# Patient Record
Sex: Male | Born: 1980 | Race: White | Marital: Married | Smoking: Never smoker
Health system: Southern US, Community
[De-identification: ages and names within clinical notes are randomized; demographics above are authoritative.]

---

## 2007-04-10 ENCOUNTER — Emergency Department: Payer: Self-pay | Admitting: Emergency Medicine

## 2007-04-18 ENCOUNTER — Emergency Department: Payer: Self-pay | Admitting: Internal Medicine

## 2007-04-24 ENCOUNTER — Emergency Department: Payer: Self-pay

## 2014-02-07 ENCOUNTER — Other Ambulatory Visit: Payer: Self-pay | Admitting: Family Medicine

## 2014-02-07 DIAGNOSIS — M25561 Pain in right knee: Secondary | ICD-10-CM

## 2014-02-10 ENCOUNTER — Other Ambulatory Visit: Payer: Self-pay | Admitting: Family Medicine

## 2014-02-10 ENCOUNTER — Ambulatory Visit
Admission: RE | Admit: 2014-02-10 | Discharge: 2014-02-10 | Disposition: A | Discharge status: Home / Self Care | Source: Ambulatory Visit | Attending: Family Medicine | Admitting: Family Medicine

## 2014-02-10 DIAGNOSIS — M25561 Pain in right knee: Secondary | ICD-10-CM

## 2014-02-10 DIAGNOSIS — S99922A Unspecified injury of left foot, initial encounter: Secondary | ICD-10-CM

## 2014-02-14 ENCOUNTER — Ambulatory Visit: Payer: Self-pay

## 2014-03-04 ENCOUNTER — Emergency Department (HOSPITAL_COMMUNITY)
Admission: EM | Admit: 2014-03-04 | Discharge: 2014-03-04 | Disposition: A | Discharge status: Home / Self Care | Attending: Emergency Medicine | Admitting: Emergency Medicine

## 2014-03-04 ENCOUNTER — Emergency Department (HOSPITAL_COMMUNITY)

## 2014-03-04 ENCOUNTER — Encounter (HOSPITAL_COMMUNITY): Payer: Self-pay | Admitting: Emergency Medicine

## 2014-03-04 DIAGNOSIS — IMO0002 Reserved for concepts with insufficient information to code with codable children: Secondary | ICD-10-CM

## 2014-03-04 DIAGNOSIS — W230XXA Caught, crushed, jammed, or pinched between moving objects, initial encounter: Secondary | ICD-10-CM | POA: Insufficient documentation

## 2014-03-04 DIAGNOSIS — S61209A Unspecified open wound of unspecified finger without damage to nail, initial encounter: Secondary | ICD-10-CM | POA: Insufficient documentation

## 2014-03-04 DIAGNOSIS — Y929 Unspecified place or not applicable: Secondary | ICD-10-CM | POA: Insufficient documentation

## 2014-03-04 DIAGNOSIS — S62639B Displaced fracture of distal phalanx of unspecified finger, initial encounter for open fracture: Secondary | ICD-10-CM | POA: Insufficient documentation

## 2014-03-04 DIAGNOSIS — Y9389 Activity, other specified: Secondary | ICD-10-CM | POA: Insufficient documentation

## 2014-03-04 MED ORDER — HYDROCODONE-ACETAMINOPHEN 5-325 MG PO TABS: 1.0000 | ORAL_TABLET | ORAL | Status: AC | PRN

## 2014-03-04 MED ORDER — CEPHALEXIN 500 MG PO CAPS: 500.0000 mg | ORAL_CAPSULE | Freq: Four times a day (QID) | ORAL | Status: AC

## 2014-03-04 NOTE — Discharge Instructions (Signed)
Please take all of your antibiotics until finished!   You may develop abdominal discomfort or diarrhea from the antibiotic.  You may help offset this with probiotics which you can buy or get in yogurt. Do not eat  or take the probiotics until 2 hours after your antibiotic.    FOLLOW UP WITH DR. GRAMIG FOR YOUR FRACTURE.  Follow up with your doctor, an urgent care, or this Emergency Department for removal of your stitches in 7 days. Do not submerge the stitches in water for the first 24 hours. Take your pain medication as prescribed. Do not operate heavy machinery while on pain medication. Note that your pain medication contains acetaminophen (Tylenol) & its is not reccommended that you use additional acetaminophen (Tylenol) while taking this medication. Read instructions below.  TREATMENT   Keep the wound clean and dry.   If you were given a bandage (dressing), you should change it at least once a day. Also, change the dressing if it becomes wet or dirty, or as directed by your caregiver.   Wash the wound with soap and water 2 times a day. Rinse the wound off with water to remove all soap. Pat the wound dry with a clean towel.   You may shower as usual after the first 24 hours. Do not soak the wound in water until the sutures are removed.   Once the wound has healed, scarring can be minimized by covering the wound with sunscreen during the day for 1 full year.Marland Kitchen.   SEEK MEDICAL CARE IF:   You have redness, swelling, or increasing pain in the wound.   You see a red line that goes away from the wound.   You have yellowish-white fluid (pus) coming from the wound.   You have a fever.   You notice a bad smell coming from the wound or dressing.   Your wound breaks open before or after sutures have been removed.   You notice something coming out of the wound such as wood or glass.   Your wound is on your hand or foot and you cannot move a finger or toe.   Your pain is not controlled with  prescribed medicine.   If you did not receive a tetanus shot today because you thought you were up to date, but did not recall when your last one was given, make sure to check with your primary caregiver to determine if you need one.

## 2014-03-04 NOTE — Progress Notes (Signed)
Orthopedic Tech Progress Note Patient Details:  Noah CoyerJimmy Stevenson 05/25/1981 [id number]  Ortho Devices Type of Ortho Device: Finger splint Ortho Device/Splint Location: rue Ortho Device/Splint Interventions: Application   Noah Stevenson 03/04/2014, 1:17 PM

## 2014-03-04 NOTE — ED Notes (Signed)
He reports he smashed his R pointer finger between a hammer and tire this am. He can wiggle the digit, skin w/d.

## 2014-03-04 NOTE — ED Provider Notes (Signed)
CSN: [id number]     Arrival date & time 03/04/14  1113 History  This chart was scribed for non-physician practitioner, Arthor CaptainAbigail Amil Bouwman, PA-C working with Doug SouSam Jacubowitz, MD by Greggory StallionKayla Andersen, ED scribe. This patient was seen in room TR06C/TR06C and the patient's care was started at 11:50 AM.   Chief Complaint  Patient presents with  . Hand Injury   The history is provided by the patient. No language interpreter was used.   HPI Comments: Noah Stevenson is a 33 y.o. male who presents to the Emergency Department complaining of right index finger injury that occurred earlier this morning. He states he smashed his finger between a hammer and a tire. Pt has a laceration to his finger and sudden onset pain with associated swelling. He also has some numbness to his finger. Palpation worsens the pain. His last tetanus was in 2014.   History reviewed. No pertinent past medical history. History reviewed. No pertinent past surgical history. History reviewed. No pertinent family history. History  Substance Use Topics  . Smoking status: Never Smoker   . Smokeless tobacco: Not on file  . Alcohol Use: Yes    Review of Systems  Constitutional: Negative for fever.  HENT: Negative for congestion.   Eyes: Negative for redness.  Respiratory: Negative for shortness of breath.   Cardiovascular: Negative for chest pain.  Gastrointestinal: Negative for abdominal distention.  Musculoskeletal: Positive for arthralgias and joint swelling.  Skin: Positive for wound.  Neurological: Positive for numbness.  Psychiatric/Behavioral: Negative for confusion.   Allergies  Review of patient's allergies indicates no known allergies.  Home Medications   Prior to Admission medications   Not on File   BP 143/89  Pulse 64  Temp(Src) 98.2 F (36.8 C) (Oral)  Resp 18  SpO2 97%  Physical Exam  Nursing note and vitals reviewed. Constitutional: He is oriented to person, place, and time. He appears well-developed and  well-nourished. No distress.  HENT:  Head: Normocephalic and atraumatic.  Eyes: EOM are normal.  Neck: Neck supple. No tracheal deviation present.  Cardiovascular: Normal rate.   Capillary refill less than 3 seconds.  Pulmonary/Chest: Effort normal. No respiratory distress.  Musculoskeletal: Normal range of motion.  1 cm jagged laceration to right first finger. Swelling at distal tip with some subdermal tissue exposed. Bruising of nail fold. Pain to palpation of distal tip.   Neurological: He is alert and oriented to person, place, and time.  Skin: Skin is warm and dry.  Psychiatric: He has a normal mood and affect. His behavior is normal.    ED Course  Procedures (including critical care time)  DIAGNOSTIC STUDIES: Oxygen Saturation is 97% on RA, normal by my interpretation.    COORDINATION OF CARE: 11:52 AM-Discussed treatment plan which includes an xray and laceration repair with pt at bedside and pt agreed to plan.   NERVE BLOCK Performed by: Arthor CaptainAbigail Jaeda Bruso Consent: Verbal consent obtained. Required items: required blood products, implants, devices, and special equipment available Time out: Immediately prior to procedure a "time out" was called to verify the correct patient, procedure, equipment, support staff and site/side marked as required.  Indication: pain relief/suture repair Nerve block body site: R 2nd finger  Preparation: Patient was prepped and draped in the usual sterile fashion. Needle gauge: 27 G Location technique: anatomical landmarks  Local anesthetic: lidocaine 2% w/o epi  Anesthetic total: 8 ml  Outcome: pain improved Patient tolerance: Patient tolerated the procedure well with no immediate complications.  LACERATION REPAIR PROCEDURE NOTE  The patient's identification was confirmed and consent was obtained. This procedure was performed by Arthor CaptainAbigail Contessa Preuss, PA-C at 12:37 PM. Site: right first finger Sterile procedures observed Anesthesia: local  infiltration Anesthetic used (type and amt): 8 mL 2% lidocaine without epi Suture type/size: 5-0 Prolene Length: 1 cm # of Sutures: 4 Technique: simple interrupted Complexity: simple Antibx ointment applied Tetanus UTD Site anesthetized, irrigated with NS, explored without evidence of foreign body, wound well approximated, site covered with dry, sterile dressing.  Patient tolerated procedure well without complications. Instructions for care discussed verbally and patient provided with additional written instructions for homecare and f/u.  SPLINT APPLICATION Date/Time: 2:44 PM Authorized by: Arthor CaptainAbigail Aubreyanna Dorrough Consent: Verbal consent obtained. Risks and benefits: risks, benefits and alternatives were discussed Consent given by: patient Splint applied by: orthopedic technician Location details: R first finger Splint type: static finger Supplies used: bandage, splint Post-procedure: The splinted body part was neurovascularly unchanged following the procedure. Patient tolerance: Patient tolerated the procedure well with no immediate complications.    Labs Review Labs Reviewed - No data to display  Imaging Review Dg Finger Index Right  03/04/2014   CLINICAL DATA:  Injury, pain distally  EXAM: RIGHT INDEX FINGER 2+V  COMPARISON:  None.  FINDINGS: There is a small minimally displaced fracture of the right second digit distal phalanx tuft. Overlying soft tissue injury noted. Normal alignment. No subluxation dislocation. Preserved joint spaces. No other acute osseous finding.  IMPRESSION: Right second digit distal phalangeal tuft fracture   Electronically Signed   By: Ruel Favorsrevor  Shick M.D.   On: 03/04/2014 11:52     EKG Interpretation None      MDM   Final diagnoses:  Open fracture of tuft of distal phalanx of finger  Laceration    Patient with open tuft fracture and laceration. He is utd on tdap. Patient's laceration repaired. Splint placed. NV intact after splint placement.  D/c  with pain meds and keflex. Follow with Hand specialist.Have sutures removed in 7 days. Supportive care discussed. Work note and precautions given.  I personally performed the services described in this documentation, which was scribed in my presence. The recorded information has been reviewed and is accurate.  Arthor CaptainAbigail Belicia Difatta, PA-C 03/06/14 [phone]

## 2014-03-04 NOTE — ED Notes (Signed)
Ortho tech called for finger splint  

## 2014-03-04 NOTE — ED Notes (Signed)
Right index finger with laceration to pad. Continues to ooze blood without pressure. Tetanus UTD.

## 2014-03-07 NOTE — ED Provider Notes (Signed)
Medical screening examination/treatment/procedure(s) were performed by non-physician practitioner and as supervising physician I was immediately available for consultation/collaboration.   EKG Interpretation None       Doug SouSam Myliah Medel, MD 03/07/14 [id number]

## 2015-09-23 IMAGING — CR DG FOOT COMPLETE 3+V*L*
3 series · 3 of 3 positions shown · non-contrast
Comparison: None.

CLINICAL DATA: 32-year-old male with left foot injury and lateral
pain.

EXAM:
LEFT FOOT - COMPLETE 3+ VIEW

[view not recorded (1 of 3)]
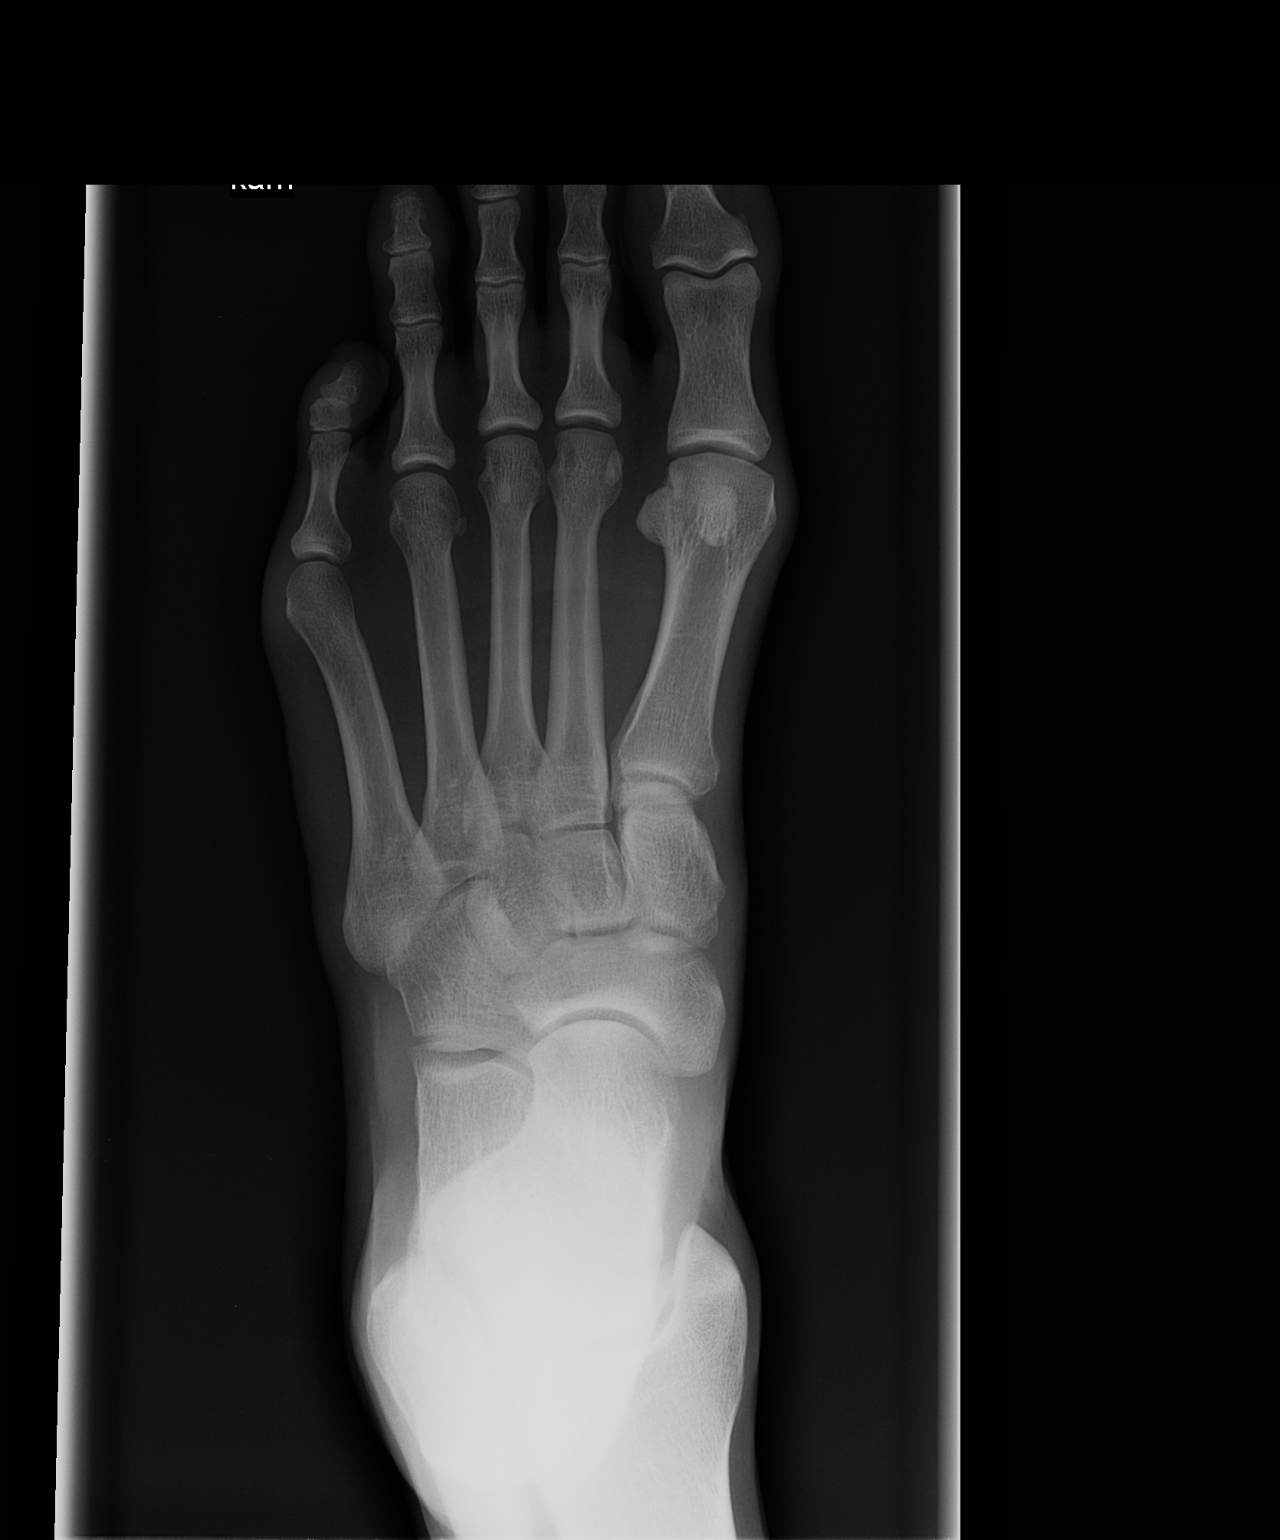

[view not recorded (2 of 3)]
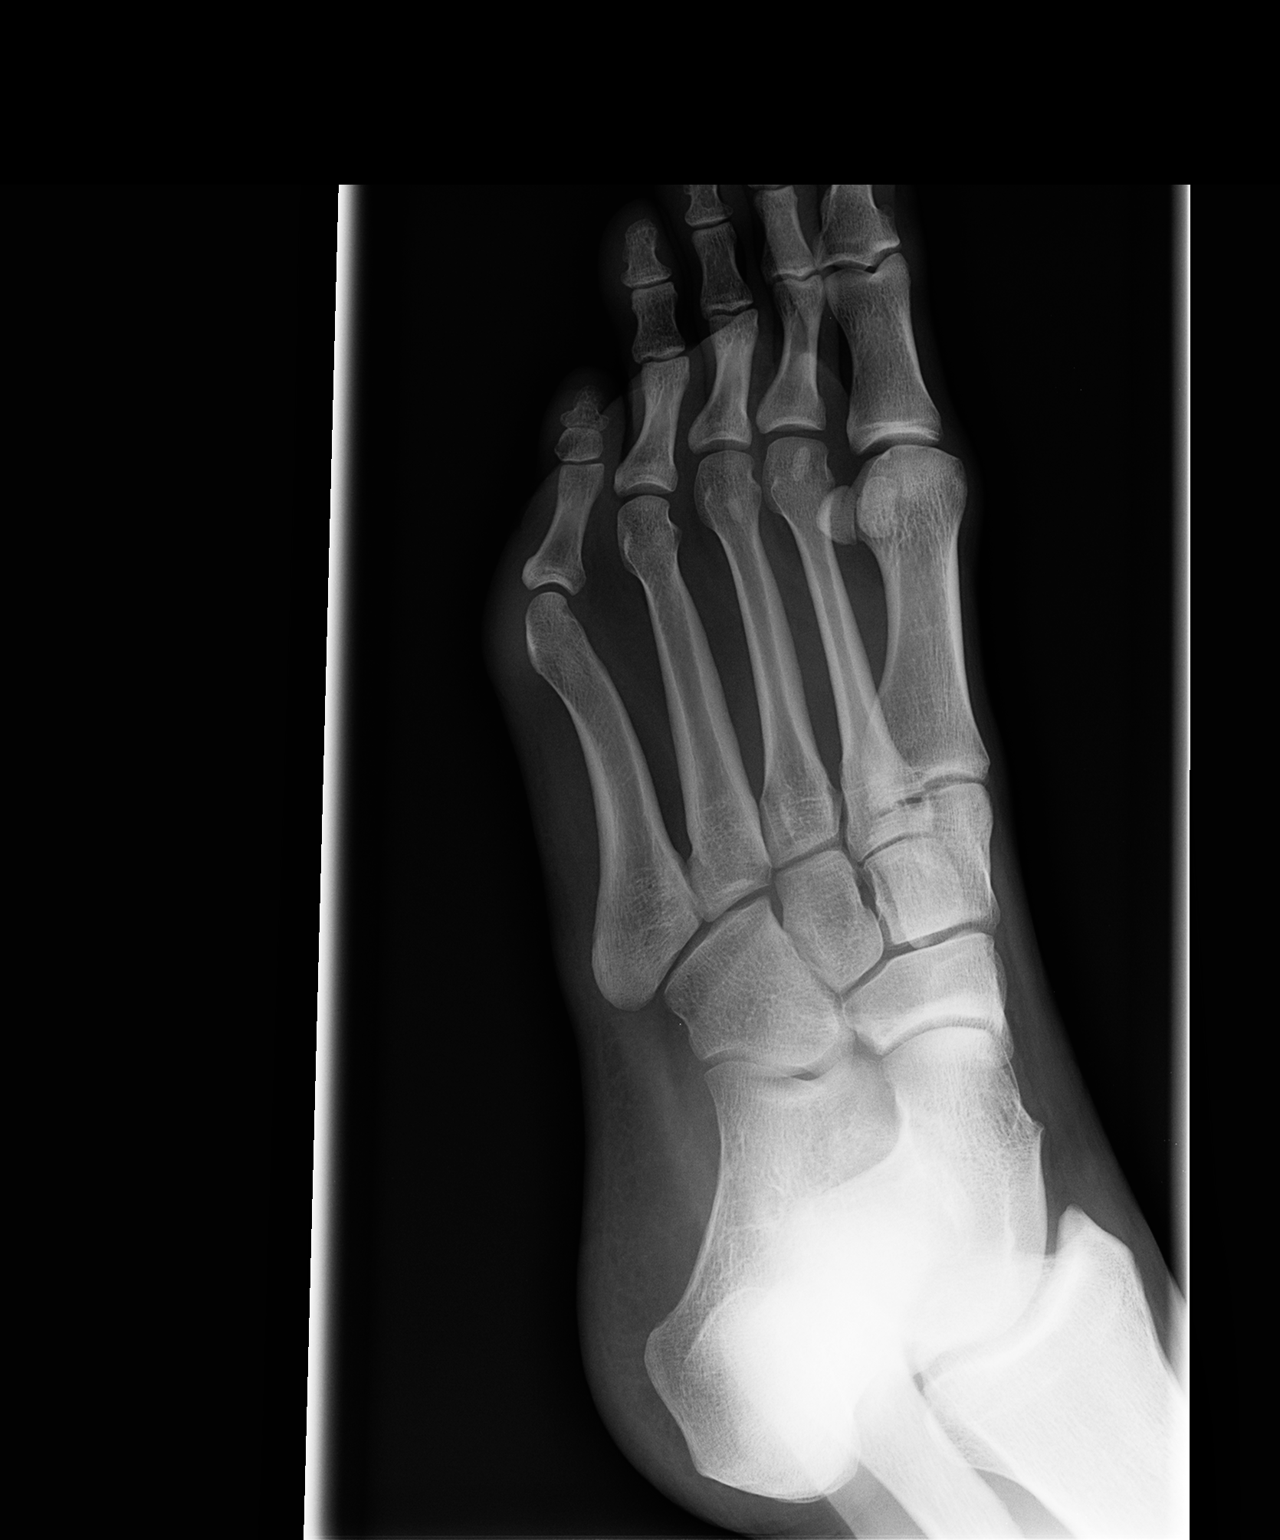

[view not recorded (3 of 3)]
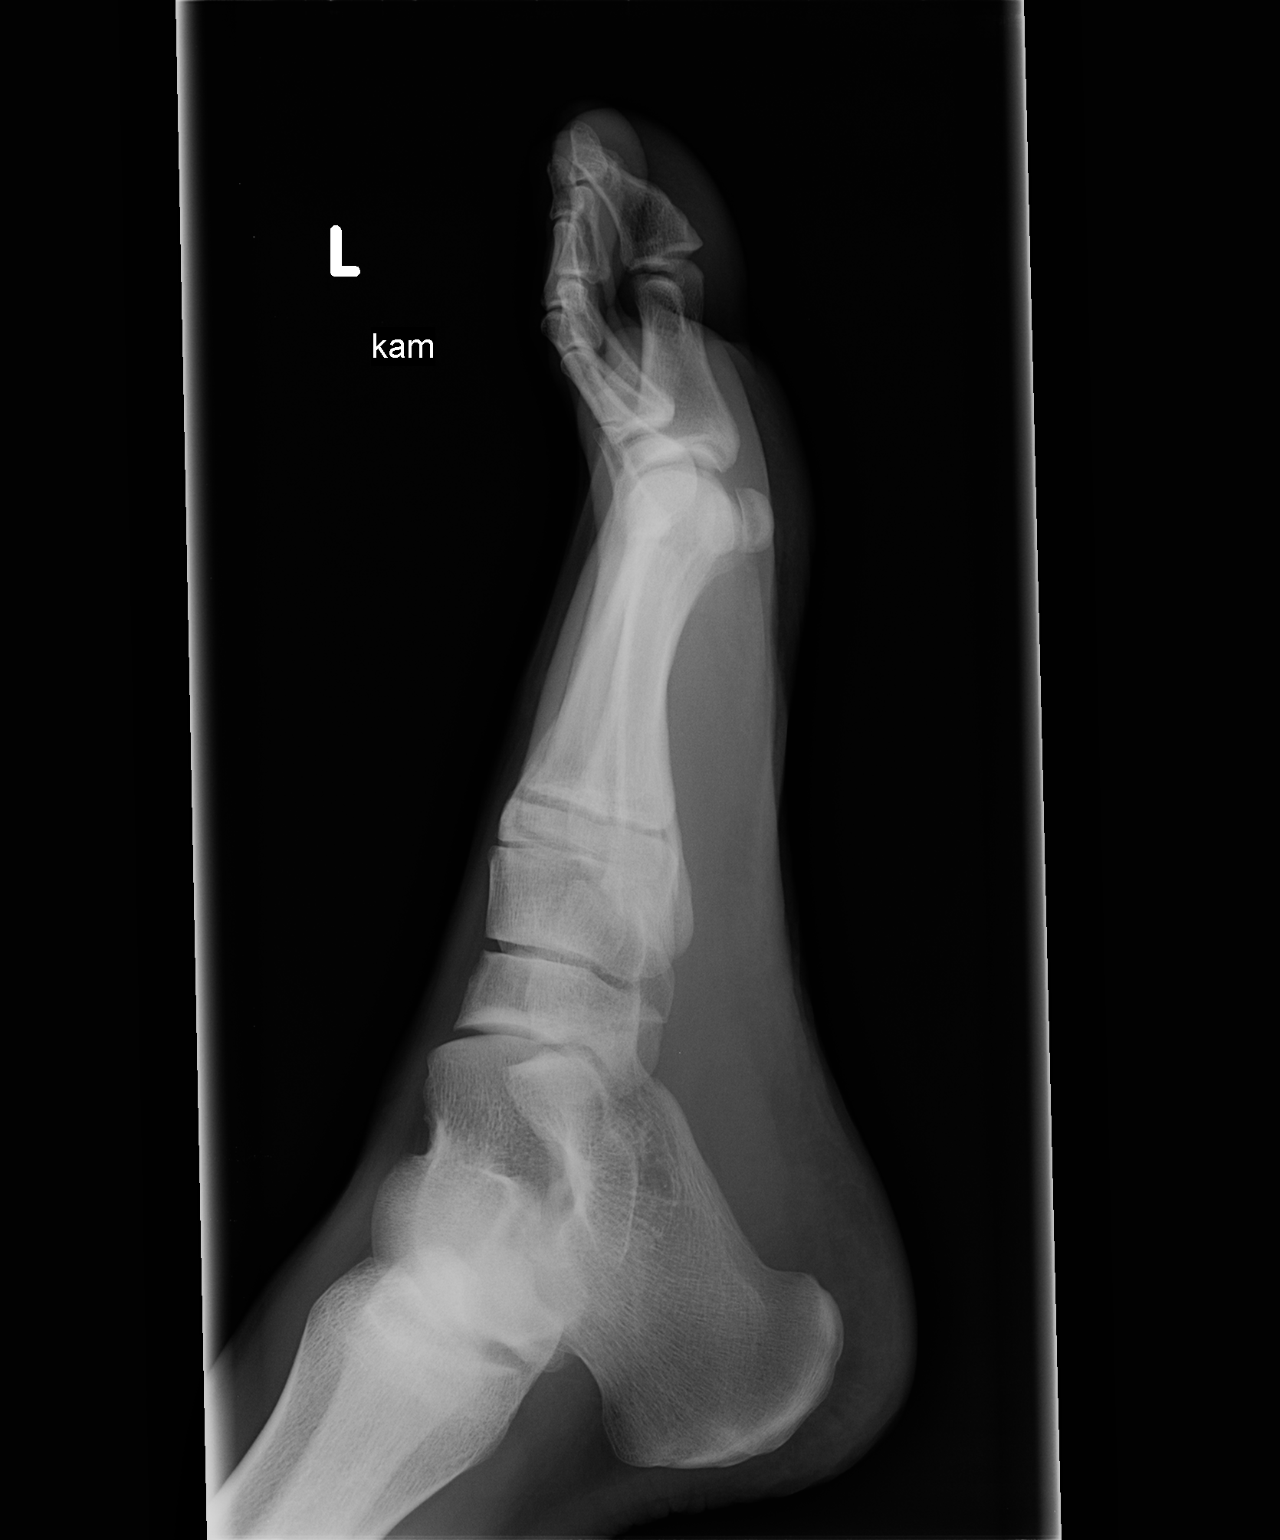

[3 of 3 positions shown; findings below may reference images not displayed]

FINDINGS: There is no evidence of fracture or dislocation. There is no
evidence of arthropathy or other focal bone abnormality. Soft
tissues are unremarkable.
IMPRESSION: Negative.
